# Patient Record
Sex: Male | Born: 1950 | Race: White | Hispanic: No | Marital: Married | Smoking: Former smoker
Health system: Southern US, Community
[De-identification: ages and names within clinical notes are randomized; demographics above are authoritative.]

## PROBLEM LIST (undated history)

## (undated) DIAGNOSIS — I4891 Unspecified atrial fibrillation: Secondary | ICD-10-CM

## (undated) DIAGNOSIS — E119 Type 2 diabetes mellitus without complications: Secondary | ICD-10-CM

## (undated) DIAGNOSIS — R1310 Dysphagia, unspecified: Secondary | ICD-10-CM

## (undated) DIAGNOSIS — E785 Hyperlipidemia, unspecified: Secondary | ICD-10-CM

---

## 2016-08-26 ENCOUNTER — Other Ambulatory Visit (HOSPITAL_COMMUNITY): Payer: Self-pay

## 2016-08-26 ENCOUNTER — Inpatient Hospital Stay
Admission: AD | Admit: 2016-08-26 | Discharge: 2016-09-24 | Disposition: A | Discharge status: Skilled Nursing Facility | Payer: Self-pay | Source: Ambulatory Visit | Attending: Internal Medicine | Admitting: Internal Medicine

## 2016-08-26 DIAGNOSIS — Z430 Encounter for attention to tracheostomy: Secondary | ICD-10-CM

## 2016-08-26 DIAGNOSIS — J9690 Respiratory failure, unspecified, unspecified whether with hypoxia or hypercapnia: Secondary | ICD-10-CM

## 2016-08-26 DIAGNOSIS — J9600 Acute respiratory failure, unspecified whether with hypoxia or hypercapnia: Secondary | ICD-10-CM

## 2016-08-26 DIAGNOSIS — Z4659 Encounter for fitting and adjustment of other gastrointestinal appliance and device: Secondary | ICD-10-CM

## 2016-08-26 DIAGNOSIS — I509 Heart failure, unspecified: Secondary | ICD-10-CM

## 2016-08-26 HISTORY — DX: Unspecified atrial fibrillation: I48.91

## 2016-08-26 HISTORY — DX: Type 2 diabetes mellitus without complications: E11.9

## 2016-08-26 HISTORY — DX: Dysphagia, unspecified: R13.10

## 2016-08-26 HISTORY — DX: Hyperlipidemia, unspecified: E78.5

## 2016-08-26 MED ORDER — DIATRIZOATE MEGLUMINE & SODIUM 66-10 % PO SOLN
ORAL | Status: AC
  Administered 2016-08-26: 18:00:00 via GASTROSTOMY
  Filled 2016-08-26: qty 30

## 2016-08-27 ENCOUNTER — Other Ambulatory Visit (HOSPITAL_COMMUNITY): Payer: Self-pay

## 2016-08-27 LAB — COMPREHENSIVE METABOLIC PANEL
ALBUMIN: 1.8 g/dL — AB (ref 3.5–5.0)
ALT: 24 U/L (ref 17–63)
AST: 23 U/L (ref 15–41)
Alkaline Phosphatase: 78 U/L (ref 38–126)
Anion gap: 7 (ref 5–15)
BILIRUBIN TOTAL: 0.5 mg/dL (ref 0.3–1.2)
BUN: 11 mg/dL (ref 6–20)
CHLORIDE: 92 mmol/L — AB (ref 101–111)
CO2: 39 mmol/L — ABNORMAL HIGH (ref 22–32)
Calcium: 8.3 mg/dL — ABNORMAL LOW (ref 8.9–10.3)
Creatinine, Ser: 0.45 mg/dL — ABNORMAL LOW (ref 0.61–1.24)
GFR calc Af Amer: >60 mL/min (ref >60)
GFR calc non Af Amer: >60 mL/min (ref >60)
GLUCOSE: 101 mg/dL — AB (ref 65–99)
POTASSIUM: 3 mmol/L — AB (ref 3.5–5.1)
Sodium: 138 mmol/L (ref 135–145)
Total Protein: 5.3 g/dL — ABNORMAL LOW (ref 6.5–8.1)

## 2016-08-27 LAB — CBC
HEMATOCRIT: 31 % — AB (ref 39.0–52.0)
Hemoglobin: 9.6 g/dL — ABNORMAL LOW (ref 13.0–17.0)
MCH: 28.9 pg (ref 26.0–34.0)
MCHC: 31 g/dL (ref 30.0–36.0)
MCV: 93.4 fL (ref 78.0–100.0)
Platelets: 168 10*3/uL (ref 150–400)
RBC: 3.32 MIL/uL — ABNORMAL LOW (ref 4.22–5.81)
RDW: 15.3 % (ref 11.5–15.5)
WBC: 9.5 10*3/uL (ref 4.0–10.5)

## 2016-08-27 LAB — URINALYSIS, ROUTINE W REFLEX MICROSCOPIC
Bilirubin Urine: NEGATIVE
GLUCOSE, UA: NEGATIVE mg/dL
Hgb urine dipstick: NEGATIVE
Ketones, ur: NEGATIVE mg/dL
LEUKOCYTES UA: NEGATIVE
Nitrite: NEGATIVE
PH: 8.5 — AB (ref 5.0–8.0)
PROTEIN: NEGATIVE mg/dL
SPECIFIC GRAVITY, URINE: 1.016 (ref 1.005–1.030)

## 2016-08-27 LAB — C DIFFICILE QUICK SCREEN W PCR REFLEX
C Diff antigen: POSITIVE — AB
C Diff toxin: NEGATIVE

## 2016-08-27 LAB — TSH: TSH: 6.02 u[IU]/mL — ABNORMAL HIGH (ref 0.350–4.500)

## 2016-08-27 LAB — CLOSTRIDIUM DIFFICILE BY PCR: CDIFFPCR: NEGATIVE

## 2016-08-27 LAB — PROTIME-INR
INR: 1.22
Prothrombin Time: 15.5 seconds — ABNORMAL HIGH (ref 11.4–15.2)

## 2016-08-28 LAB — URINE CULTURE: Culture: NO GROWTH

## 2016-08-30 ENCOUNTER — Encounter: Payer: Self-pay | Admitting: Adult Health

## 2016-08-30 DIAGNOSIS — I5020 Unspecified systolic (congestive) heart failure: Secondary | ICD-10-CM

## 2016-08-30 DIAGNOSIS — I509 Heart failure, unspecified: Secondary | ICD-10-CM

## 2016-08-30 DIAGNOSIS — J9600 Acute respiratory failure, unspecified whether with hypoxia or hypercapnia: Secondary | ICD-10-CM

## 2016-08-30 LAB — BASIC METABOLIC PANEL
Anion gap: 9 (ref 5–15)
BUN: 21 mg/dL — AB (ref 6–20)
CALCIUM: 8.6 mg/dL — AB (ref 8.9–10.3)
CO2: 36 mmol/L — ABNORMAL HIGH (ref 22–32)
CREATININE: 0.61 mg/dL (ref 0.61–1.24)
Chloride: 94 mmol/L — ABNORMAL LOW (ref 101–111)
GFR calc Af Amer: >60 mL/min (ref >60)
Glucose, Bld: 142 mg/dL — ABNORMAL HIGH (ref 65–99)
Potassium: 3.7 mmol/L (ref 3.5–5.1)
SODIUM: 139 mmol/L (ref 135–145)

## 2016-08-30 NOTE — Consult Note (Signed)
Name: Edward Sellers MRN: [id number] DOB: [birth year]    ADMISSION DATE:  08/26/2016 CONSULTATION DATE:  9/18  REFERRING MD :  Hijazi (select)   CHIEF COMPLAINT:  Hypoxia   BRIEF PATIENT DESCRIPTION: 66yo male with hx AS s/p valve replacement 07/22/2016 with subsequent post op respiratory failure r/t PNA v TRALI v amiodarone toxicity in setting new onset AFib with RVR.  Amiodarone was stopped, he was treated with IV abx as well as steroids.  Pt failed to wean from vent and ultimately required tracheostomy placement on POD #19 as well as PEG placement.  He was tx 9/14 to [facility] for ongoing vent wean and rehab efforts.    SIGNIFICANT EVENTS    STUDIES:   HISTORY OF PRESENT ILLNESS:  65yo male with hx AS s/p valve replacement 07/22/2016 with subsequent post op respiratory failure r/t PNA v TRALI v amiodarone toxicity in setting new onset AFib with RVR.  Amiodarone was stopped, he was treated with IV abx as well as steroids.  Pt failed to wean from vent and ultimately required tracheostomy placement on POD #19 as well as PEG placement.  He was tx 9/14 to [facility] for ongoing vent wean and rehab efforts.    PT currently on full support with c/o SOB and increased secretions.  Denies chest pain.    PAST MEDICAL HISTORY :   has a past medical history of A-fib ([place]); Diabetes ([place]); Dysphagia; and Hyperlipidemia.  has no past surgical history on file. Prior to Admission medications   Not on File   Allergies  Allergen Reactions  . Cyclobenzaprine   . Penicillins     FAMILY HISTORY:  family history includes COPD in his other; Diabetes in his other; Heart failure in his other. SOCIAL HISTORY:  reports that he has quit smoking. He has never used smokeless tobacco.  REVIEW OF SYSTEMS:   Difficult to obtain pt on vent via trach.  As per HPI - All other systems reviewed and were neg.   SUBJECTIVE:   VITAL SIGNS: HR 101, BP 155/80 RR 31/12 sats 93%    PHYSICAL EXAMINATION: General:   Chronically ill appearing male, NAD  Neuro:  Awake, alert, seems appropriate, MAE  HEENT:  Mm moist, no JVD  Cardiovascular:  s1s2 rrr Lungs:  resps even non labored on full support, diminished bases, few scattered crackles  Abdomen:  Soft, +bs  Musculoskeletal:  Warm and dry, no sig edema    Recent Labs Lab 08/27/16 0500 08/30/16 0500  NA 138 139  K 3.0* 3.7  CL 92* 94*  CO2 39* 36*  BUN 11 21*  CREATININE 0.45* 0.61  GLUCOSE 101* 142*    Recent Labs Lab 08/27/16 0500  HGB 9.6*  HCT 31.0*  WBC 9.5  PLT 168   No results found.  ASSESSMENT / PLAN:  Acute respiratory failure - multifactorial post aortic valve replacement with HCAP v TRALI v amiodarone toxicity.  Suspect underlying COPD   PLAN -  Wean per protocol  Continue off amiodarone although not clear this was the culprit  F/u CXR  abx per primary  HR control  No indication systemic steroids  BD's  Nutritional support     [name], NP 08/30/2016  10:34 AM Pager: [phone] or [phone]  STAFF NOTE: I, Merrie Roof, MD FACP have personally reviewed patient's available data, including medical history, events of note, physical examination and test results as part of my evaluation. I have discussed with resident/NP and other care  providers such as pharmacist, RN and RRT. In addition, I personally evaluated patient and elicited key findings of: awake on vent, coarse BS, jvd noted, low muscle mass, r/o amio vs trali vs edema or combination, get esr in am , secrtions noted but likely he can attempt PS weaning today ps 12, need to max lasix to neg balance as renal fxn tolerates, goal neg 1 liter daily, if he remains stagnent would in fact chellenge him with steroids 60 q12h solumedral, we will see how he makes any progress or not, may need CT chest here   Lavon Paganini. Titus Mould, MD, [place] Pgr: [facility] 08/30/2016 12:39 PM

## 2016-08-31 ENCOUNTER — Other Ambulatory Visit (HOSPITAL_COMMUNITY): Payer: Self-pay

## 2016-08-31 LAB — PROTIME-INR
INR: 1.16
Prothrombin Time: 14.9 seconds (ref 11.4–15.2)

## 2016-08-31 LAB — STOOL CULTURE: E COLI SHIGA TOXIN ASSAY: NEGATIVE

## 2016-08-31 LAB — BRAIN NATRIURETIC PEPTIDE: B NATRIURETIC PEPTIDE 5: 331.3 pg/mL — AB (ref 0.0–100.0)

## 2016-08-31 LAB — STOOL CULTURE REFLEX - CMPCXR

## 2016-08-31 LAB — STOOL CULTURE REFLEX - RSASHR

## 2016-09-01 LAB — PROTIME-INR
INR: 1.28
Prothrombin Time: 16.1 seconds — ABNORMAL HIGH (ref 11.4–15.2)

## 2016-09-02 ENCOUNTER — Other Ambulatory Visit (HOSPITAL_COMMUNITY): Payer: Self-pay

## 2016-09-02 DIAGNOSIS — J9602 Acute respiratory failure with hypercapnia: Secondary | ICD-10-CM

## 2016-09-02 DIAGNOSIS — J9601 Acute respiratory failure with hypoxia: Secondary | ICD-10-CM

## 2016-09-02 LAB — PROTIME-INR
INR: 1.63
PROTHROMBIN TIME: 19.5 s — AB (ref 11.4–15.2)

## 2016-09-02 NOTE — Progress Notes (Signed)
Name: Edward Sellers MRN: [id number] DOB: 1951-06-02    ADMISSION DATE:  08/26/2016 CONSULTATION DATE:  9/18  REFERRING MD :  Hijazi (select)   CHIEF COMPLAINT:  Hypoxia   BRIEF PATIENT DESCRIPTION: 65yo male with hx AS s/p valve replacement 07/22/2016 with subsequent post op respiratory failure r/t PNA v TRALI v amiodarone toxicity in setting new onset AFib with RVR.  Amiodarone was stopped, he was treated with IV abx as well as steroids.  Pt failed to wean from vent and ultimately required tracheostomy placement on POD #19 as well as PEG placement.  He was tx 9/14 to [facility] for ongoing vent wean and rehab efforts.    SIGNIFICANT EVENTS    STUDIES:    SUBJECTIVE: Tolerating PS 12-14 hours.    REVIEW OF SYSTEMS:  Unable to obtain with ventilator requirement.  VITAL SIGNS: HR 88, BP 150/70 RR 26 sats 94%    PHYSICAL EXAMINATION: General:  Chronically ill appearing male, NAD  Neuro:  Awake, alert, seems appropriate, MAE  HEENT:  Mm moist, no JVD  Cardiovascular:  s1s2 rrr Lungs:  resps even non labored on PS, diminished bases, few scattered crackles  Abdomen:  Soft, +bs  Musculoskeletal:  Warm and dry, no sig edema    Recent Labs Lab 08/27/16 0500 08/30/16 0500  NA 138 139  K 3.0* 3.7  CL 92* 94*  CO2 39* 36*  BUN 11 21*  CREATININE 0.45* 0.61  GLUCOSE 101* 142*    Recent Labs Lab 08/27/16 0500  HGB 9.6*  HCT 31.0*  WBC 9.5  PLT 168   No results found.  ASSESSMENT / PLAN:  Acute respiratory failure - multifactorial post aortic valve replacement with HCAP v TRALI v amiodarone toxicity.  Suspect underlying COPD   PLAN -  Continue PS wean per protocol  Continue off amiodarone although not clear this was the culprit  F/u CXR  abx per primary  HR control  No indication systemic steroids  BD's  Nutritional support  Diuresis as BP and Scr tolerate  If weaning slows, consider CT chest and rx AECOPD - hold for now with improved weaning    [name]  Whiteheart, NP 09/02/2016  9:25 AM Pager: [phone] or [phone]  PCCM Attending Note: Patient seen and examined with nurse practitioner. Please refer to her progress note. No acute events overnight. Tolerating pressure support wean. Patient denies any difficulty breathing at this time or pain with a head nod.  Vital signs: Reviewed and as above. Ventilator: Pressure support/FiO2 0.35/PEEP 5 General:  Awake.No acute distress. Sitting watching TV. No family at bedside.  Integument:  Warm & dry. No rash on exposed skin.  HEENT:  No scleral injection or icterus. Tracheostomy in place. PERRL. Cardiovascular:  Regular rhythm. No appreciable JVD.  Pulmonary:  Slightly diminished breath sounds bilateral lung bases. Otherwise clear to auscultation. Mildly increased work of breathing on pressure support wean. Abdomen: Soft. Normal bowel sounds. Nondistended.  Musculoskeletal:  No joint  effusion appreciated.  Neurological: Wiggling toes and nodding head on commands. Grossly nonfocal.  A/P: 65 year old male status post aortic valve replacement. Patient seems to be tolerating pressure support wean and transition hopefully to the point where he can undergo tracheostomy collar.  1. Acute respiratory failure with hypoxia & hypercarbia: Possibly related to COPD exacerbation versus transfusion related acute lung injury versus amiodarone lung toxicity. Continuing pressure support wean and weaning FiO2 for saturation greater than 92%. Continuing bronchodilators. No role for steroid therapy at this  time. If patient continues to tolerate pressure support wean planned to transition to tracheostomy collar over the next 24-48 hours.  Remainder of plan of care per primary service.  Donna Christen Jamison Neighbor, M.D. [facility] Pager:  [phone] After 3pm or if no response, call [phone] 4:54 PM 09/02/16

## 2016-09-03 ENCOUNTER — Other Ambulatory Visit (HOSPITAL_COMMUNITY): Payer: Self-pay

## 2016-09-03 LAB — BASIC METABOLIC PANEL
ANION GAP: 9 (ref 5–15)
BUN: 30 mg/dL — AB (ref 6–20)
CHLORIDE: 89 mmol/L — AB (ref 101–111)
CO2: 38 mmol/L — ABNORMAL HIGH (ref 22–32)
Calcium: 8.7 mg/dL — ABNORMAL LOW (ref 8.9–10.3)
Creatinine, Ser: 0.58 mg/dL — ABNORMAL LOW (ref 0.61–1.24)
GFR calc Af Amer: >60 mL/min (ref >60)
GFR calc non Af Amer: >60 mL/min (ref >60)
GLUCOSE: 139 mg/dL — AB (ref 65–99)
POTASSIUM: 3.6 mmol/L (ref 3.5–5.1)
Sodium: 136 mmol/L (ref 135–145)

## 2016-09-03 LAB — PROTIME-INR
INR: 2.91
Prothrombin Time: 31 seconds — ABNORMAL HIGH (ref 11.4–15.2)

## 2016-09-03 LAB — CBC
HEMATOCRIT: 33.5 % — AB (ref 39.0–52.0)
HEMOGLOBIN: 10.3 g/dL — AB (ref 13.0–17.0)
MCH: 28.7 pg (ref 26.0–34.0)
MCHC: 30.7 g/dL (ref 30.0–36.0)
MCV: 93.3 fL (ref 78.0–100.0)
Platelets: 256 10*3/uL (ref 150–400)
RBC: 3.59 MIL/uL — AB (ref 4.22–5.81)
RDW: 14.7 % (ref 11.5–15.5)
WBC: 13.7 10*3/uL — ABNORMAL HIGH (ref 4.0–10.5)

## 2016-09-04 LAB — PROTIME-INR
INR: 2.95
PROTHROMBIN TIME: 31.3 s — AB (ref 11.4–15.2)

## 2016-09-05 LAB — PROTIME-INR
INR: 2.02
PROTHROMBIN TIME: 23.2 s — AB (ref 11.4–15.2)

## 2016-09-06 DIAGNOSIS — I5021 Acute systolic (congestive) heart failure: Secondary | ICD-10-CM

## 2016-09-06 LAB — PROTIME-INR
INR: 1.95
Prothrombin Time: 22.5 seconds — ABNORMAL HIGH (ref 11.4–15.2)

## 2016-09-06 NOTE — Progress Notes (Signed)
   Name: Edward Sellers L Weingart MRN: [id number] DOB: Dec 13, 1951    ADMISSION DATE:  08/26/2016 CONSULTATION DATE:  9/18  REFERRING MD :  Hijazi (select)   CHIEF COMPLAINT:  Hypoxia   BRIEF PATIENT DESCRIPTION: 65yo male with hx AS s/p valve replacement 07/22/2016 with subsequent post op respiratory failure r/t PNA v TRALI v amiodarone toxicity in setting new onset AFib with RVR.  Amiodarone was stopped, he was treated with IV abx as well as steroids.  Pt failed to wean from vent and ultimately required tracheostomy placement on POD #19 as well as PEG placement.  He was tx 9/14 to [facility] for ongoing vent wean and rehab efforts.    SIGNIFICANT EVENTS    STUDIES:    SUBJECTIVE:  Doing PST. Did 8 hrs of PST on 9/24.    VITAL SIGNS: VS reviewed. O2 sats  94% on PST.    PHYSICAL EXAMINATION: General:  Chronically ill appearing male, NAD  Neuro:  Awake, alert, seems appropriate, MAE  HEENT:  Mm moist, no JVD  Cardiovascular:  s1s2 rrr Lungs:  resps even non labored on PS, diminished bases, few scattered crackles  Abdomen:  Soft, +bs  Musculoskeletal:  Warm and dry, no sig edema    Recent Labs Lab 09/03/16 0500  NA 136  K 3.6  CL 89*  CO2 38*  BUN 30*  CREATININE 0.58*  GLUCOSE 139*    Recent Labs Lab 09/03/16 0500  HGB 10.3*  HCT 33.5*  WBC 13.7*  PLT 256   No results found.  ASSESSMENT / PLAN:  Acute respiratory failure - multifactorial post aortic valve replacement with HCAP v TRALI v amiodarone toxicity.  Suspect underlying COPD   PLAN -  Continue PS wean per protocol. Cont PST as tolerated. Keep O2 sats > 88-90%.  Continue off amiodarone although not clear this was the culprit  BD's  Nutritional support  Diuresis as BP and Scr tolerate   J. Alexis FrockAngelo A de Dios, MD 09/06/2016, 12:09 PM [facility] Pager [phone] After 3 pm or if no answer, call [phone]

## 2016-09-07 ENCOUNTER — Other Ambulatory Visit (HOSPITAL_COMMUNITY): Payer: Self-pay

## 2016-09-07 LAB — BASIC METABOLIC PANEL
ANION GAP: 8 (ref 5–15)
BUN: 48 mg/dL — AB (ref 6–20)
CALCIUM: 9.4 mg/dL (ref 8.9–10.3)
CO2: 39 mmol/L — ABNORMAL HIGH (ref 22–32)
Chloride: 91 mmol/L — ABNORMAL LOW (ref 101–111)
Creatinine, Ser: 0.71 mg/dL (ref 0.61–1.24)
GFR calc Af Amer: >60 mL/min (ref >60)
GLUCOSE: 117 mg/dL — AB (ref 65–99)
Potassium: 4.2 mmol/L (ref 3.5–5.1)
SODIUM: 138 mmol/L (ref 135–145)

## 2016-09-07 LAB — PROTIME-INR
INR: 2.31
PROTHROMBIN TIME: 25.8 s — AB (ref 11.4–15.2)

## 2016-09-08 LAB — PROTIME-INR
INR: 2.4
PROTHROMBIN TIME: 26.6 s — AB (ref 11.4–15.2)

## 2016-09-09 ENCOUNTER — Other Ambulatory Visit (HOSPITAL_COMMUNITY): Payer: Self-pay

## 2016-09-09 DIAGNOSIS — I5022 Chronic systolic (congestive) heart failure: Secondary | ICD-10-CM

## 2016-09-09 LAB — BASIC METABOLIC PANEL
ANION GAP: 11 (ref 5–15)
BUN: 50 mg/dL — ABNORMAL HIGH (ref 6–20)
CALCIUM: 9.3 mg/dL (ref 8.9–10.3)
CO2: 37 mmol/L — ABNORMAL HIGH (ref 22–32)
Chloride: 91 mmol/L — ABNORMAL LOW (ref 101–111)
Creatinine, Ser: 0.77 mg/dL (ref 0.61–1.24)
Glucose, Bld: 157 mg/dL — ABNORMAL HIGH (ref 65–99)
Potassium: 3.9 mmol/L (ref 3.5–5.1)
SODIUM: 139 mmol/L (ref 135–145)

## 2016-09-09 LAB — PROTIME-INR
INR: 2.4
Prothrombin Time: 26.6 seconds — ABNORMAL HIGH (ref 11.4–15.2)

## 2016-09-09 NOTE — Progress Notes (Signed)
   Name: Edward Sellers MRN: [id number] DOB: 07/05/1951    ADMISSION DATE:  08/26/2016 CONSULTATION DATE:  9/18  REFERRING MD :  Hijazi (select)   CHIEF COMPLAINT:  Hypoxia   BRIEF PATIENT DESCRIPTION: 65yo male with hx AS s/p valve replacement 07/22/2016 with subsequent post op respiratory failure r/t PNA v TRALI v amiodarone toxicity in setting new onset AFib with RVR.  Amiodarone was stopped, he was treated with IV abx as well as steroids.  Pt failed to wean from vent and ultimately required tracheostomy placement on POD #19 as well as PEG placement.  He was tx 9/14 to [facility] for ongoing vent wean and rehab efforts.    SIGNIFICANT EVENTS    STUDIES:    SUBJECTIVE:  Tolerating ATC well, on 35% x 12 hrs.   VITAL SIGNS: 96.4, 90, 20, 123/34, 100%.   PHYSICAL EXAMINATION: General:  Chronically ill appearing male, NAD   Neuro:  Awake, alert, seems appropriate, MAE  HEENT:  Mm moist, no JVD  Cardiovascular:  RRR, no M/R/G Lungs:  resps even non labored on ATC, diminished bases, few scattered crackles  Abdomen:  Soft, +bs  Musculoskeletal:  Warm and dry, no sig edema    Recent Labs Lab 09/03/16 0500 09/07/16 0600 09/09/16 0500  NA 136 138 139  K 3.6 4.2 3.9  CL 89* 91* 91*  CO2 38* 39* 37*  BUN 30* 48* 50*  CREATININE 0.58* 0.71 0.77  GLUCOSE 139* 117* 157*    Recent Labs Lab 09/03/16 0500  HGB 10.3*  HCT 33.5*  WBC 13.7*  PLT 256   Dg Chest Port 1 View  Result Date: 09/09/2016 CLINICAL DATA:  Respiratory failure EXAM: PORTABLE CHEST 1 VIEW COMPARISON:  Two days ago FINDINGS: Right upper extremity PICC with tip at the distal SVC. Tracheostomy tube in place. Normal heart size and mediastinal contours. Status post aortic valve replacement. Unchanged diffuse coarse interstitial opacity. Patient has chart history of recent pneumonia versus transfusion associated lung injury versus amiodarone toxicity. Stable pleural thickening or fluid on the right that is small  volume. No pneumothorax. Postoperative right lung base. IMPRESSION: Unchanged generalized interstitial opacity as above. Stable PICC and tracheostomy positioning. Electronically Signed   By: Marnee SpringJonathon  Watts M.D.   On: 09/09/2016 07:25    ASSESSMENT / PLAN:  Acute respiratory failure - multifactorial post aortic valve replacement with HCAP v TRALI v amiodarone toxicity.  Suspect underlying COPD   PLAN -  Continue ATC as tolerated and continue weaning. Keep O2 sats > 88-90%.  Continue off amiodarone although not clear this was the culprit. BD's. Pulmonary hygiene. Diuresis as BP and Scr tolerate.   Rutherford Guysahul Gavrielle Streck, [place] - C [place] [facility] Pager: [phone]  or [phone] 09/09/2016, 12:18 PM

## 2016-09-10 LAB — PROTIME-INR
INR: 2.84
PROTHROMBIN TIME: 30.4 s — AB (ref 11.4–15.2)

## 2016-09-11 LAB — BASIC METABOLIC PANEL
ANION GAP: 10 (ref 5–15)
BUN: 58 mg/dL — ABNORMAL HIGH (ref 6–20)
CALCIUM: 9.7 mg/dL (ref 8.9–10.3)
CO2: 38 mmol/L — AB (ref 22–32)
Chloride: 92 mmol/L — ABNORMAL LOW (ref 101–111)
Creatinine, Ser: 0.75 mg/dL (ref 0.61–1.24)
GFR calc Af Amer: >60 mL/min (ref >60)
GFR calc non Af Amer: >60 mL/min (ref >60)
GLUCOSE: 169 mg/dL — AB (ref 65–99)
POTASSIUM: 4.2 mmol/L (ref 3.5–5.1)
Sodium: 140 mmol/L (ref 135–145)

## 2016-09-11 LAB — EXPECTORATED SPUTUM ASSESSMENT W GRAM STAIN, RFLX TO RESP C

## 2016-09-11 LAB — EXPECTORATED SPUTUM ASSESSMENT W REFEX TO RESP CULTURE

## 2016-09-12 LAB — CBC WITH DIFFERENTIAL/PLATELET
BASOS ABS: 0.1 10*3/uL (ref 0.0–0.1)
BASOS PCT: 1 %
EOS ABS: 0.3 10*3/uL (ref 0.0–0.7)
EOS PCT: 2 %
HCT: 36.3 % — ABNORMAL LOW (ref 39.0–52.0)
Hemoglobin: 11.2 g/dL — ABNORMAL LOW (ref 13.0–17.0)
LYMPHS ABS: 3.2 10*3/uL (ref 0.7–4.0)
Lymphocytes Relative: 19 %
MCH: 29.4 pg (ref 26.0–34.0)
MCHC: 30.9 g/dL (ref 30.0–36.0)
MCV: 95.3 fL (ref 78.0–100.0)
Monocytes Absolute: 1.2 10*3/uL — ABNORMAL HIGH (ref 0.1–1.0)
Monocytes Relative: 7 %
Neutro Abs: 12 10*3/uL — ABNORMAL HIGH (ref 1.7–7.7)
Neutrophils Relative %: 71 %
PLATELETS: 393 10*3/uL (ref 150–400)
RBC: 3.81 MIL/uL — AB (ref 4.22–5.81)
RDW: 14.8 % (ref 11.5–15.5)
WBC: 16.9 10*3/uL — AB (ref 4.0–10.5)

## 2016-09-12 LAB — COMPREHENSIVE METABOLIC PANEL
ALT: 31 U/L (ref 17–63)
ANION GAP: 9 (ref 5–15)
AST: 30 U/L (ref 15–41)
Albumin: 2.8 g/dL — ABNORMAL LOW (ref 3.5–5.0)
Alkaline Phosphatase: 123 U/L (ref 38–126)
BUN: 62 mg/dL — AB (ref 6–20)
CHLORIDE: 92 mmol/L — AB (ref 101–111)
CO2: 39 mmol/L — ABNORMAL HIGH (ref 22–32)
Calcium: 9.8 mg/dL (ref 8.9–10.3)
Creatinine, Ser: 0.8 mg/dL (ref 0.61–1.24)
GFR calc Af Amer: >60 mL/min (ref >60)
Glucose, Bld: 162 mg/dL — ABNORMAL HIGH (ref 65–99)
POTASSIUM: 4.1 mmol/L (ref 3.5–5.1)
SODIUM: 140 mmol/L (ref 135–145)
TOTAL PROTEIN: 8.2 g/dL — AB (ref 6.5–8.1)
Total Bilirubin: 0.4 mg/dL (ref 0.3–1.2)

## 2016-09-12 LAB — PROTIME-INR
INR: 3.43
PROTHROMBIN TIME: 35.4 s — AB (ref 11.4–15.2)

## 2016-09-13 DIAGNOSIS — Z430 Encounter for attention to tracheostomy: Secondary | ICD-10-CM

## 2016-09-13 LAB — CBC WITH DIFFERENTIAL/PLATELET
Basophils Absolute: 0.1 10*3/uL (ref 0.0–0.1)
Basophils Relative: 0 %
Eosinophils Absolute: 0.4 10*3/uL (ref 0.0–0.7)
Eosinophils Relative: 2 %
HCT: 37.4 % — ABNORMAL LOW (ref 39.0–52.0)
HEMOGLOBIN: 11.4 g/dL — AB (ref 13.0–17.0)
LYMPHS ABS: 3.5 10*3/uL (ref 0.7–4.0)
Lymphocytes Relative: 18 %
MCH: 29.2 pg (ref 26.0–34.0)
MCHC: 30.5 g/dL (ref 30.0–36.0)
MCV: 95.7 fL (ref 78.0–100.0)
MONO ABS: 1.4 10*3/uL — AB (ref 0.1–1.0)
MONOS PCT: 7 %
NEUTROS PCT: 72 %
Neutro Abs: 13.7 10*3/uL — ABNORMAL HIGH (ref 1.7–7.7)
Platelets: 391 10*3/uL (ref 150–400)
RBC: 3.91 MIL/uL — ABNORMAL LOW (ref 4.22–5.81)
RDW: 14.9 % (ref 11.5–15.5)
WBC: 19 10*3/uL — ABNORMAL HIGH (ref 4.0–10.5)

## 2016-09-13 LAB — PROTIME-INR
INR: 3.35
Prothrombin Time: 34.7 seconds — ABNORMAL HIGH (ref 11.4–15.2)

## 2016-09-13 NOTE — Progress Notes (Signed)
   Name: Edward Sellers L Stumph MRN: [id number] DOB: 1951-01-22    ADMISSION DATE:  08/26/2016 CONSULTATION DATE:  9/18  REFERRING MD :  Hijazi (select)   CHIEF COMPLAINT:  Hypoxia   BRIEF PATIENT DESCRIPTION: 65yo male with hx AS s/p valve replacement 07/22/2016 with subsequent post op respiratory failure r/t PNA v TRALI v amiodarone toxicity in setting new onset AFib with RVR.  Amiodarone was stopped, he was treated with IV abx as well as steroids.  Pt failed to wean from vent and ultimately required tracheostomy placement on POD #19 as well as PEG placement.  He was tx 9/14 to [facility] for ongoing vent wean and rehab efforts.    SIGNIFICANT EVENTS    STUDIES:    SUBJECTIVE:  Doing PST. Did 8 hrs of PST on 9/24.    VITAL SIGNS: VS reviewed. O2 sats  98% on ATC 28%.    PHYSICAL EXAMINATION: General:  Chronically ill appearing male, NAD  Neuro:  Awake, alert, seems appropriate, MAE  HEENT:  Mm moist, no JVD  Cardiovascular:  s1s2 rrr Lungs:  resps even non labored on PS, diminished bases, few scattered crackles  Abdomen:  Soft, +bs  Musculoskeletal:  Warm and dry, no sig edema    Recent Labs Lab 09/09/16 0500 09/11/16 0240 09/12/16 0430  NA 139 140 140  K 3.9 4.2 4.1  CL 91* 92* 92*  CO2 37* 38* 39*  BUN 50* 58* 62*  CREATININE 0.77 0.75 0.80  GLUCOSE 157* 169* 162*    Recent Labs Lab 09/12/16 0430 09/13/16 0130  HGB 11.2* 11.4*  HCT 36.3* 37.4*  WBC 16.9* 19.0*  PLT 393 391   No results found.  ASSESSMENT / PLAN:  Acute respiratory failure - multifactorial post aortic valve replacement with HCAP v TRALI v amiodarone toxicity.  Suspect underlying COPD   PLAN -  On ATC 24 hrs now. Vent prn. Keep o2 sats > 88%.  Continue off amiodarone although not clear this was the culprit  BD's  Nutritional support  Diuresis as BP and Scr tolerate   J. Alexis FrockAngelo A de Dios, MD 09/13/2016, 3:00 PM [place] [facility] Pager [phone] After 3 pm or if  no answer, call [phone]

## 2016-09-14 LAB — PROTIME-INR
INR: 3.21
Prothrombin Time: 33.6 seconds — ABNORMAL HIGH (ref 11.4–15.2)

## 2016-09-15 LAB — CULTURE, RESPIRATORY

## 2016-09-15 LAB — PROTIME-INR
INR: 2.25
Prothrombin Time: 25.3 seconds — ABNORMAL HIGH (ref 11.4–15.2)

## 2016-09-15 LAB — CULTURE, RESPIRATORY W GRAM STAIN

## 2016-09-16 ENCOUNTER — Other Ambulatory Visit (HOSPITAL_COMMUNITY): Payer: Self-pay

## 2016-09-16 LAB — PROTIME-INR
INR: 1.73
PROTHROMBIN TIME: 20.4 s — AB (ref 11.4–15.2)

## 2016-09-17 LAB — PROTIME-INR
INR: 1.73
PROTHROMBIN TIME: 20.5 s — AB (ref 11.4–15.2)

## 2016-09-18 ENCOUNTER — Institutional Professional Consult (permissible substitution) (HOSPITAL_COMMUNITY): Payer: Self-pay

## 2016-09-18 LAB — BASIC METABOLIC PANEL
Anion gap: 8 (ref 5–15)
BUN: 53 mg/dL — AB (ref 6–20)
CHLORIDE: 97 mmol/L — AB (ref 101–111)
CO2: 31 mmol/L (ref 22–32)
CREATININE: 0.64 mg/dL (ref 0.61–1.24)
Calcium: 9.3 mg/dL (ref 8.9–10.3)
Glucose, Bld: 144 mg/dL — ABNORMAL HIGH (ref 65–99)
Potassium: 4 mmol/L (ref 3.5–5.1)
SODIUM: 136 mmol/L (ref 135–145)

## 2016-09-18 LAB — CBC
HCT: 34.6 % — ABNORMAL LOW (ref 39.0–52.0)
HEMOGLOBIN: 10.7 g/dL — AB (ref 13.0–17.0)
MCH: 28.7 pg (ref 26.0–34.0)
MCHC: 30.9 g/dL (ref 30.0–36.0)
MCV: 92.8 fL (ref 78.0–100.0)
PLATELETS: 242 10*3/uL (ref 150–400)
RBC: 3.73 MIL/uL — AB (ref 4.22–5.81)
RDW: 15 % (ref 11.5–15.5)
WBC: 15.5 10*3/uL — ABNORMAL HIGH (ref 4.0–10.5)

## 2016-09-18 LAB — PROTIME-INR
INR: 1.76
PROTHROMBIN TIME: 20.8 s — AB (ref 11.4–15.2)

## 2016-09-19 LAB — PROTIME-INR
INR: 1.83
Prothrombin Time: 21.4 seconds — ABNORMAL HIGH (ref 11.4–15.2)

## 2016-09-20 LAB — PROTIME-INR
INR: 1.77
Prothrombin Time: 20.8 seconds — ABNORMAL HIGH (ref 11.4–15.2)

## 2016-09-20 NOTE — Progress Notes (Signed)
   Name: Edward Sellers L Pulley MRN: [id number] DOB: 1951/06/03    ADMISSION DATE:  08/26/2016 CONSULTATION DATE:  9/18  REFERRING MD :  Hijazi (select)   CHIEF COMPLAINT:  Hypoxia   BRIEF PATIENT DESCRIPTION: 65yo male with hx AS s/p valve replacement 07/22/2016 with subsequent post op respiratory failure r/t PNA v TRALI v amiodarone toxicity in setting new onset AFib with RVR.  Amiodarone was stopped, he was treated with IV abx as well as steroids.  Pt failed to wean from vent and ultimately required tracheostomy placement on POD #19 as well as PEG placement.  He was tx 9/14 to [facility] for ongoing vent wean and rehab efforts.    SIGNIFICANT EVENTS    STUDIES:    SUBJECTIVE:  Capped now, has been for 48 hours and tolerating well. Good phonation.   VITAL SIGNS: 98.61F, 100, 20, 108/72, 96% on room air.   PHYSICAL EXAMINATION: General:  Chronically ill appearing male, in NAD Neuro:  Awake, alert, answering questions appropriately. Non-focal HEENT:  Mm moist, no JVD  Cardiovascular:  s1s2 rrr Lungs:  resps even non labored on PS, diminished bases, few scattered crackles  Abdomen:  Soft, +bs  Musculoskeletal:  Warm and dry, no sig edema    Recent Labs Lab 09/18/16 0505  NA 136  K 4.0  CL 97*  CO2 31  BUN 53*  CREATININE 0.64  GLUCOSE 144*    Recent Labs Lab 09/18/16 0505  HGB 10.7*  HCT 34.6*  WBC 15.5*  PLT 242   No results found.  ASSESSMENT / PLAN:  Acute respiratory failure - multifactorial post aortic valve replacement with HCAP v TRALI v amiodarone toxicity.  Suspect underlying COPD   PLAN -  Capped 48 hours. Decannulate today  Continue off amiodarone although not clear this was the culprit  BD's  Nutritional support  Diuresis as BP and Scr tolerate   PCCM will sign off  Joneen Roachaul Hoffman, AGACNP-BC [place] [facility] Pager [phone] or [phone]  09/20/2016 1:45 PM   Attending note: I have seen and examined the patient with  nurse practitioner/resident and agree with the note. History, labs and imaging reviewed.  64 Y/O with AS s/p valve replacement. Prolonged vent dependence.  Doing well after cap on sat. Will plan on decannulation today.  Chilton GreathousePraveen Hallie Ishida MD [place] [facility] Pager [phone] If no answer or after 3pm call: [phone] 09/20/2016, 2:26 PM

## 2016-09-21 LAB — CBC
HCT: 32.7 % — ABNORMAL LOW (ref 39.0–52.0)
HEMOGLOBIN: 10.2 g/dL — AB (ref 13.0–17.0)
MCH: 29.1 pg (ref 26.0–34.0)
MCHC: 31.2 g/dL (ref 30.0–36.0)
MCV: 93.2 fL (ref 78.0–100.0)
Platelets: 194 10*3/uL (ref 150–400)
RBC: 3.51 MIL/uL — AB (ref 4.22–5.81)
RDW: 15.3 % (ref 11.5–15.5)
WBC: 16.2 10*3/uL — AB (ref 4.0–10.5)

## 2016-09-21 LAB — BASIC METABOLIC PANEL
ANION GAP: 7 (ref 5–15)
BUN: 36 mg/dL — AB (ref 6–20)
CHLORIDE: 96 mmol/L — AB (ref 101–111)
CO2: 32 mmol/L (ref 22–32)
Calcium: 9.1 mg/dL (ref 8.9–10.3)
Creatinine, Ser: 0.62 mg/dL (ref 0.61–1.24)
GFR calc Af Amer: >60 mL/min (ref >60)
Glucose, Bld: 158 mg/dL — ABNORMAL HIGH (ref 65–99)
POTASSIUM: 4.5 mmol/L (ref 3.5–5.1)
SODIUM: 135 mmol/L (ref 135–145)

## 2016-09-21 LAB — PROTIME-INR
INR: 1.44
PROTHROMBIN TIME: 17.7 s — AB (ref 11.4–15.2)

## 2016-09-22 ENCOUNTER — Other Ambulatory Visit (HOSPITAL_COMMUNITY): Payer: Self-pay

## 2016-09-22 LAB — PROTIME-INR
INR: 1.35
PROTHROMBIN TIME: 16.8 s — AB (ref 11.4–15.2)

## 2016-09-23 ENCOUNTER — Other Ambulatory Visit (HOSPITAL_COMMUNITY): Payer: Self-pay

## 2016-09-23 LAB — CBC
HEMATOCRIT: 30.9 % — AB (ref 39.0–52.0)
Hemoglobin: 9.9 g/dL — ABNORMAL LOW (ref 13.0–17.0)
MCH: 28.9 pg (ref 26.0–34.0)
MCHC: 32 g/dL (ref 30.0–36.0)
MCV: 90.4 fL (ref 78.0–100.0)
PLATELETS: 181 10*3/uL (ref 150–400)
RBC: 3.42 MIL/uL — AB (ref 4.22–5.81)
RDW: 15.8 % — ABNORMAL HIGH (ref 11.5–15.5)
WBC: 15 10*3/uL — ABNORMAL HIGH (ref 4.0–10.5)

## 2016-09-23 LAB — BASIC METABOLIC PANEL
Anion gap: 9 (ref 5–15)
BUN: 35 mg/dL — AB (ref 6–20)
CO2: 32 mmol/L (ref 22–32)
Calcium: 9.1 mg/dL (ref 8.9–10.3)
Chloride: 92 mmol/L — ABNORMAL LOW (ref 101–111)
Creatinine, Ser: 0.65 mg/dL (ref 0.61–1.24)
GFR calc Af Amer: >60 mL/min (ref >60)
GLUCOSE: 147 mg/dL — AB (ref 65–99)
POTASSIUM: 4.4 mmol/L (ref 3.5–5.1)
Sodium: 133 mmol/L — ABNORMAL LOW (ref 135–145)

## 2016-09-23 LAB — PROTIME-INR
INR: 1.45
Prothrombin Time: 17.8 seconds — ABNORMAL HIGH (ref 11.4–15.2)

## 2016-09-24 LAB — PROTIME-INR
INR: 1.63
Prothrombin Time: 19.5 seconds — ABNORMAL HIGH (ref 11.4–15.2)

## 2017-04-02 IMAGING — CR DG CHEST 1V PORT
1 series · 1 of 1 positions shown · non-contrast
Comparison: 09/09/2016

CLINICAL DATA: Followup ventilator support.

EXAM:
PORTABLE CHEST 1 VIEW

[AP]
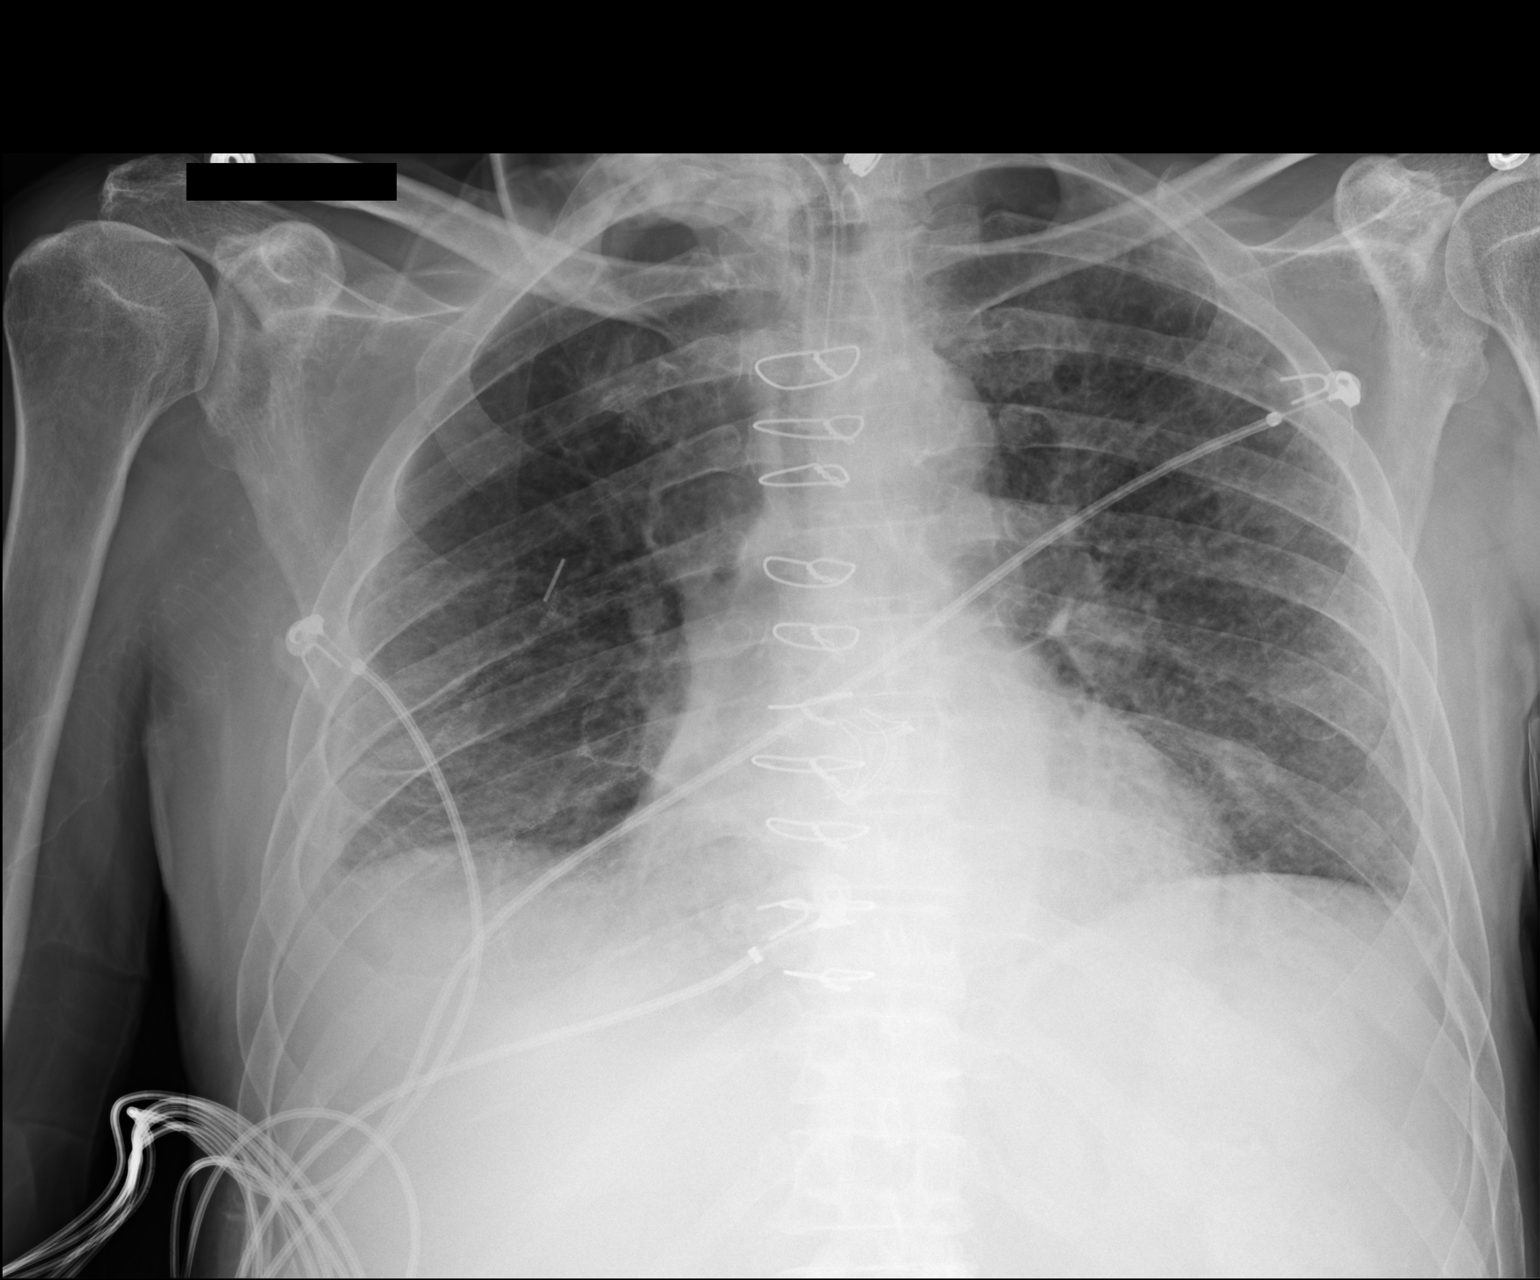

[1 of 1 positions shown; findings below may reference images not displayed]

FINDINGS: Tracheostomy remains in place. Infiltrate in the lower lungs has
improved since last exam. Complete clearing has not occurred. Small
amount of pleural fluid and/or pleural scarring on the right appears
the same.
IMPRESSION: Improved lower lung infiltrates.

## 2017-04-07 IMAGING — DX DG CHEST 1V PORT
1 series · 1 of 1 positions shown · non-contrast
Comparison: 09/18/2016 and prior chest radiographs

CLINICAL DATA: Hypoxia and respiratory failure. Recent aortic valve
replacement.

EXAM:
PORTABLE CHEST 1 VIEW

[chest ap]
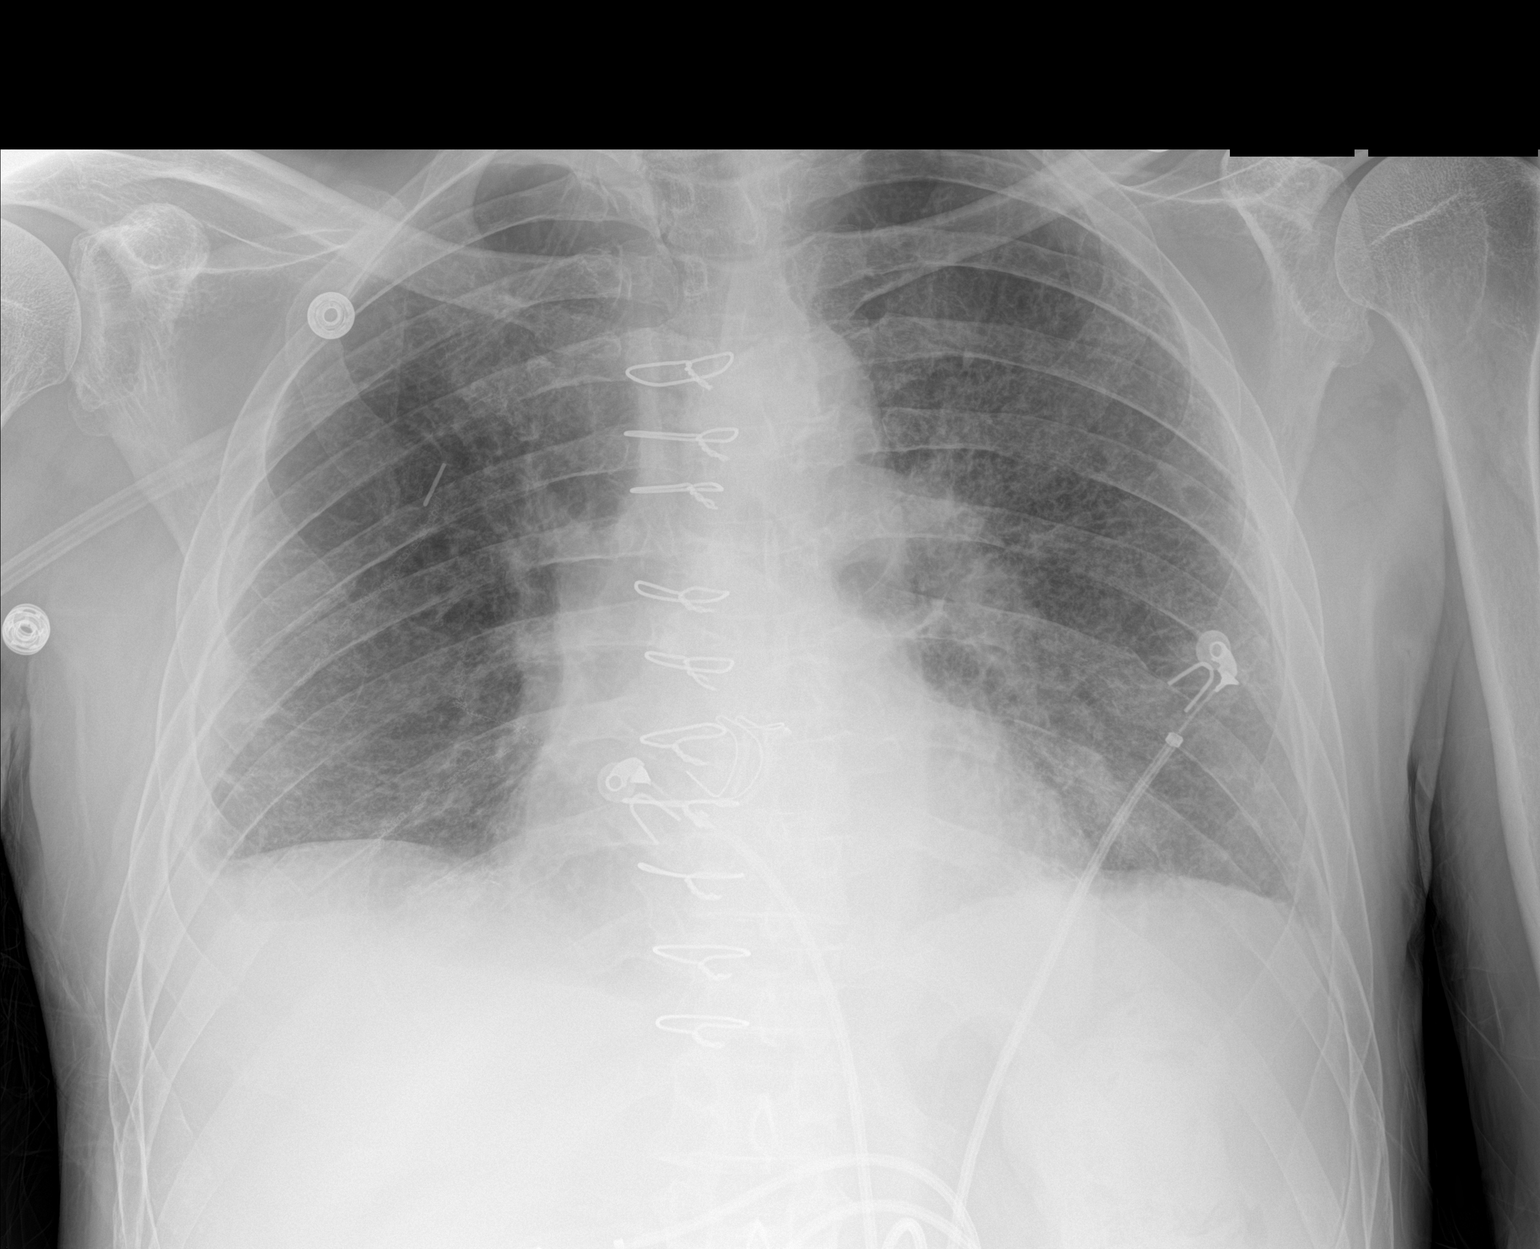

[1 of 1 positions shown; findings below may reference images not displayed]

FINDINGS: Cardiomegaly and aortic valve replacement changes again noted.

The tracheostomy tube is no longer visualized.

Mild interstitial prominence, small right pleural effusion/pleural
thickening and mild left lower lung opacity are unchanged.

There is no evidence of pneumothorax.

There has been little interval change since prior study.
IMPRESSION: Little significant change except for tracheostomy tube no longer
visualized/removed. Mild left lower lung airspace disease versus
atelectasis again noted.

## 2020-09-12 DEATH — deceased
